# Patient Record
Sex: Female | Born: 1935 | Race: White | Hispanic: No | Marital: Married
Health system: Southern US, Community
[De-identification: ages and names within clinical notes are randomized; demographics above are authoritative.]

---

## 2004-05-20 ENCOUNTER — Other Ambulatory Visit: Payer: Self-pay

## 2007-01-18 ENCOUNTER — Emergency Department: Payer: Self-pay

## 2007-01-18 ENCOUNTER — Other Ambulatory Visit: Payer: Self-pay

## 2007-11-14 ENCOUNTER — Inpatient Hospital Stay: Payer: Self-pay | Admitting: Specialist

## 2007-11-14 ENCOUNTER — Other Ambulatory Visit: Payer: Self-pay

## 2008-01-27 ENCOUNTER — Other Ambulatory Visit: Payer: Self-pay

## 2008-01-27 ENCOUNTER — Emergency Department: Payer: Self-pay | Admitting: Emergency Medicine

## 2008-03-07 ENCOUNTER — Inpatient Hospital Stay: Payer: Self-pay | Admitting: Specialist

## 2008-03-07 ENCOUNTER — Other Ambulatory Visit: Payer: Self-pay

## 2008-07-30 ENCOUNTER — Emergency Department: Payer: Self-pay | Admitting: Internal Medicine

## 2008-12-05 ENCOUNTER — Emergency Department: Payer: Self-pay | Admitting: Emergency Medicine

## 2009-03-07 ENCOUNTER — Emergency Department: Payer: Self-pay | Admitting: Emergency Medicine

## 2009-03-11 ENCOUNTER — Inpatient Hospital Stay: Payer: Self-pay | Admitting: Internal Medicine

## 2009-03-15 ENCOUNTER — Emergency Department: Payer: Self-pay | Admitting: Emergency Medicine

## 2009-03-26 ENCOUNTER — Ambulatory Visit: Payer: Self-pay | Admitting: Neurology

## 2009-06-06 ENCOUNTER — Ambulatory Visit: Payer: Self-pay | Admitting: Neurology

## 2009-12-23 ENCOUNTER — Emergency Department: Payer: Self-pay | Admitting: Emergency Medicine

## 2011-03-04 ENCOUNTER — Ambulatory Visit: Payer: Self-pay | Admitting: Urology

## 2012-09-14 ENCOUNTER — Inpatient Hospital Stay: Payer: Self-pay | Admitting: Internal Medicine

## 2012-09-14 LAB — COMPREHENSIVE METABOLIC PANEL
Alkaline Phosphatase: 112 U/L (ref 50–136)
Anion Gap: 8 (ref 7–16)
BUN: 26 mg/dL — ABNORMAL HIGH (ref 7–18)
Calcium, Total: 9.3 mg/dL (ref 8.5–10.1)
Chloride: 106 mmol/L (ref 98–107)
Co2: 27 mmol/L (ref 21–32)
Creatinine: 1.01 mg/dL (ref 0.60–1.30)
EGFR (African American): 55 — ABNORMAL LOW
EGFR (Non-African Amer.): 48 — ABNORMAL LOW
SGOT(AST): 41 U/L — ABNORMAL HIGH (ref 15–37)
SGPT (ALT): 25 U/L (ref 12–78)
Total Protein: 7 g/dL (ref 6.4–8.2)

## 2012-09-14 LAB — CK TOTAL AND CKMB (NOT AT ARMC)
CK-MB: 3.2 ng/mL (ref 0.5–3.6)
CK-MB: 4.1 ng/mL — ABNORMAL HIGH (ref 0.5–3.6)

## 2012-09-14 LAB — URINALYSIS, COMPLETE
Bacteria: NONE SEEN
Bilirubin,UR: NEGATIVE
Blood: NEGATIVE
Glucose,UR: NEGATIVE mg/dL (ref 0–75)
Hyaline Cast: 1
Ketone: NEGATIVE
Leukocyte Esterase: NEGATIVE
Nitrite: NEGATIVE
Ph: 5 (ref 4.5–8.0)
Specific Gravity: 1.017 (ref 1.003–1.030)
Squamous Epithelial: <1
WBC UR: <1 /HPF (ref 0–5)

## 2012-09-14 LAB — TROPONIN I
Troponin-I: 0.06 ng/mL — ABNORMAL HIGH
Troponin-I: 0.1 ng/mL — ABNORMAL HIGH

## 2012-09-14 LAB — PROTIME-INR
INR: 0.9
Prothrombin Time: 12.7 secs (ref 11.5–14.7)

## 2012-09-14 LAB — CBC
HCT: 47.3 % — ABNORMAL HIGH (ref 35.0–47.0)
HGB: 15.4 g/dL (ref 12.0–16.0)
MCHC: 32.6 g/dL (ref 32.0–36.0)
MCV: 96 fL (ref 80–100)
RDW: 13.6 % (ref 11.5–14.5)
WBC: 22.5 10*3/uL — ABNORMAL HIGH (ref 3.6–11.0)

## 2012-09-14 LAB — TSH: Thyroid Stimulating Horm: 1.44 u[IU]/mL

## 2012-09-15 LAB — CBC WITH DIFFERENTIAL/PLATELET
Basophil #: 0 10*3/uL (ref 0.0–0.1)
Basophil %: 0.3 %
Eosinophil %: 0.5 %
HCT: 33.3 % — ABNORMAL LOW (ref 35.0–47.0)
HGB: 11.1 g/dL — ABNORMAL LOW (ref 12.0–16.0)
Lymphocyte %: 14 %
Monocyte #: 1.1 x10 3/mm — ABNORMAL HIGH (ref 0.2–0.9)
Monocyte %: 8.7 %
Neutrophil #: 9.8 10*3/uL — ABNORMAL HIGH (ref 1.4–6.5)
RBC: 3.48 10*6/uL — ABNORMAL LOW (ref 3.80–5.20)
RDW: 13.5 % (ref 11.5–14.5)
WBC: 12.8 10*3/uL — ABNORMAL HIGH (ref 3.6–11.0)

## 2012-09-15 LAB — TROPONIN I: Troponin-I: 0.13 ng/mL — ABNORMAL HIGH

## 2012-09-15 LAB — BASIC METABOLIC PANEL
Calcium, Total: 8 mg/dL — ABNORMAL LOW (ref 8.5–10.1)
Chloride: 110 mmol/L — ABNORMAL HIGH (ref 98–107)
EGFR (Non-African Amer.): 40 — ABNORMAL LOW
Osmolality: 286 (ref 275–301)
Potassium: 4 mmol/L (ref 3.5–5.1)
Sodium: 142 mmol/L (ref 136–145)

## 2012-09-15 LAB — CK TOTAL AND CKMB (NOT AT ARMC)
CK, Total: 370 U/L — ABNORMAL HIGH (ref 21–215)
CK-MB: 4.6 ng/mL — ABNORMAL HIGH (ref 0.5–3.6)

## 2012-09-20 LAB — CULTURE, BLOOD (SINGLE)

## 2012-12-03 DEATH — deceased

## 2012-12-30 IMAGING — CT CT HEAD WITHOUT CONTRAST
3 of 4 series · 17 of 30 positions shown, 19 images · non-contrast
Comparison: none

REASON FOR EXAM: altered mental status, ground level fall
COMMENTS:   May transport without cardiac monitor

[Series 2: without · axial · non-contrast · 0.42mm/px · z∈[+106,+216]mm · 5 of 34 slices shown]
[im 6/34  brain]
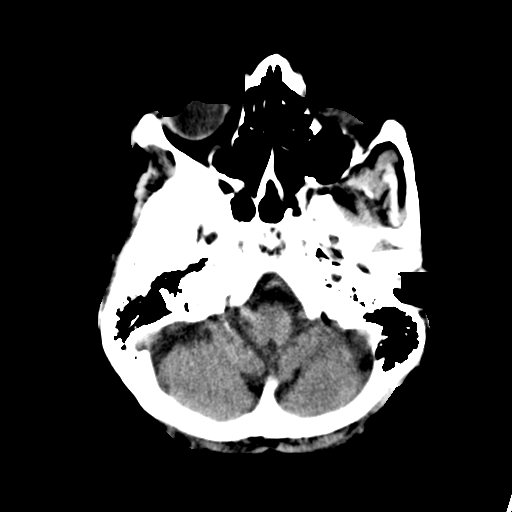
[im 12/34  brain]
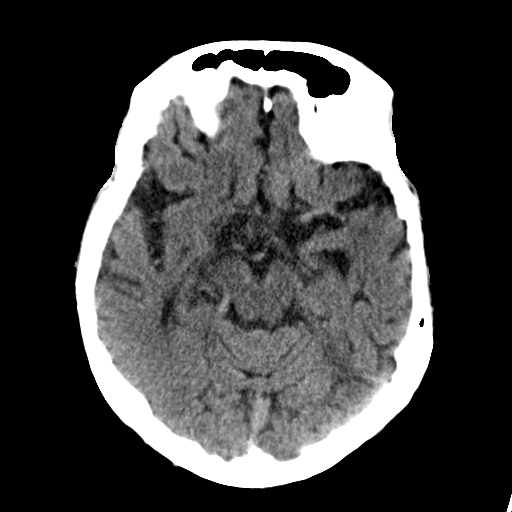
[im 17/34  brain]
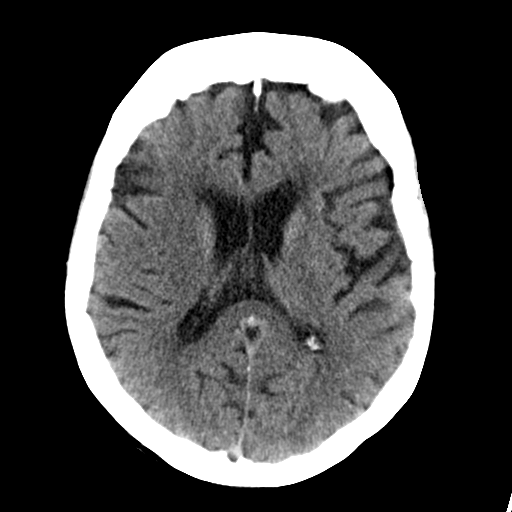
[im 23/34  brain]
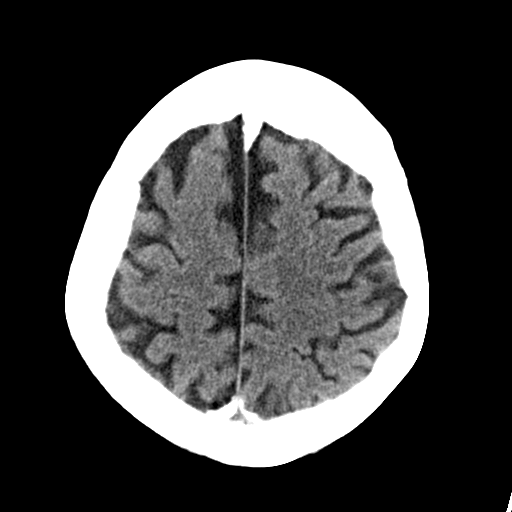
[im 28/34  brain]
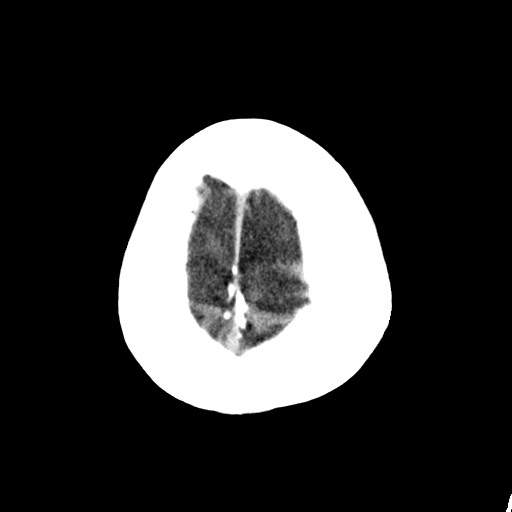

[Series 4: without 2 · axial · non-contrast · 0.42mm/px · z∈[+76,+204]mm · 6 of 38 slices shown, 8 images]
[im 6/38  brain]
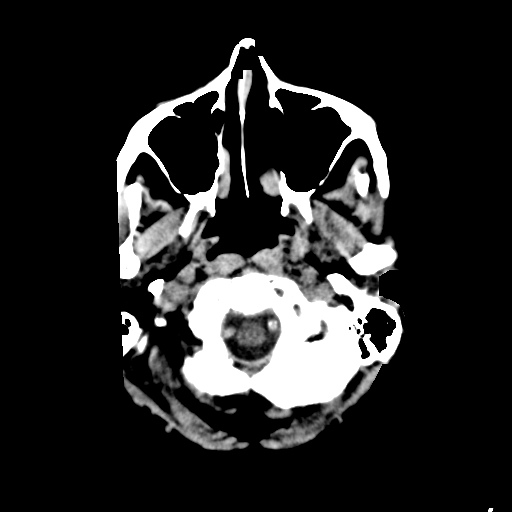
[im 6/38  bone]
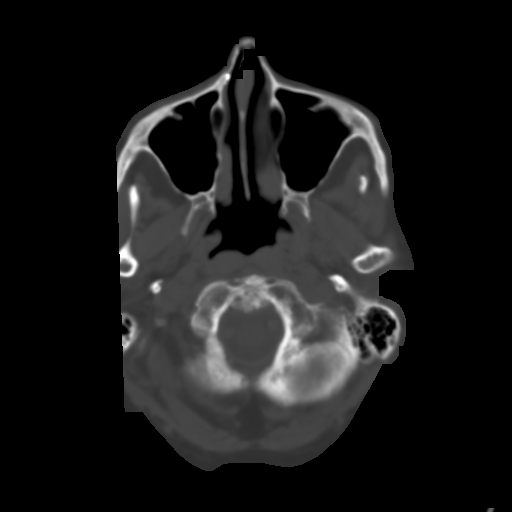
[im 11/38  brain]
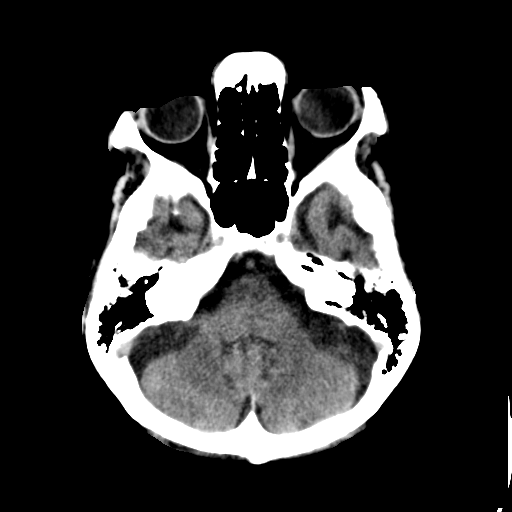
[im 16/38  brain]
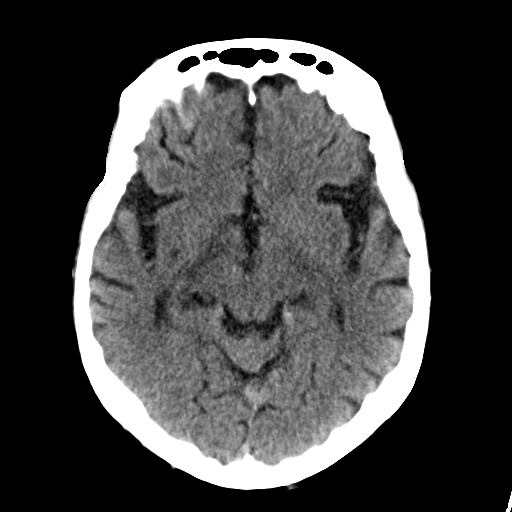
[im 22/38  brain]
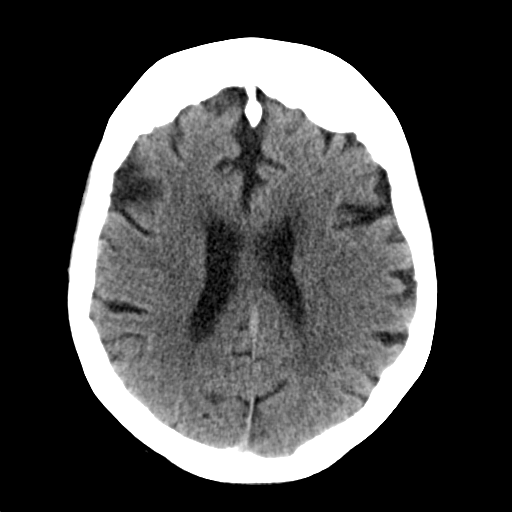
[im 27/38  brain]
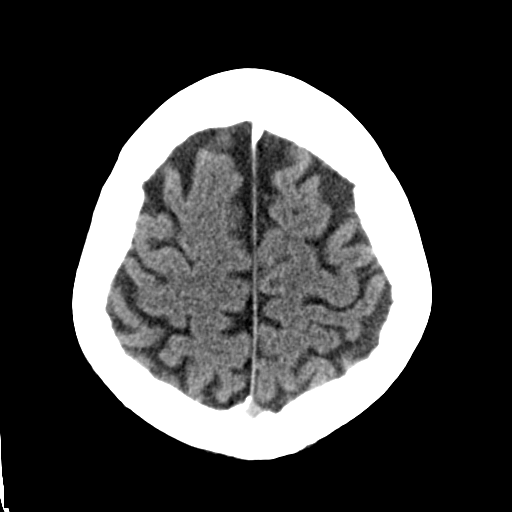
[im 27/38  bone]
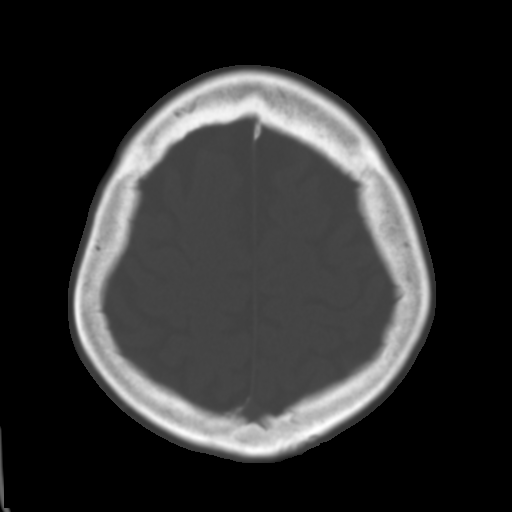
[im 32/38  brain]
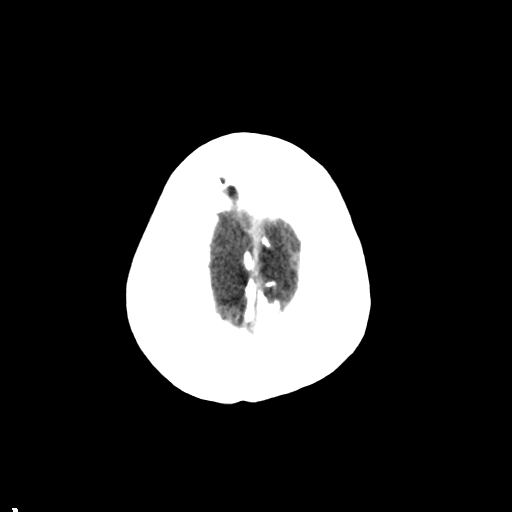

[Series 5: bone 2 · axial · 0.42mm/px · z∈[+88,+212]mm · 6 of 37 slices shown]
[im 6/37  bone]
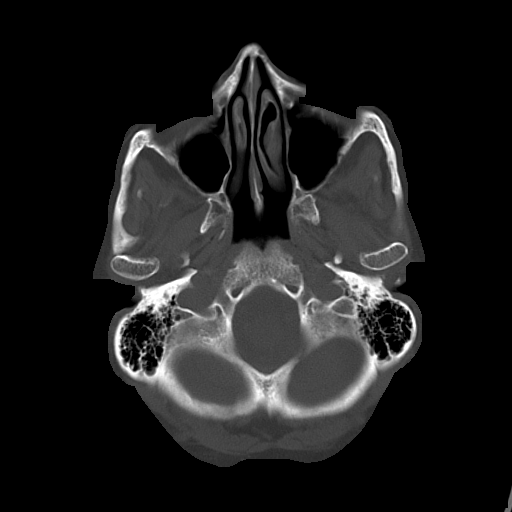
[im 11/37  bone]
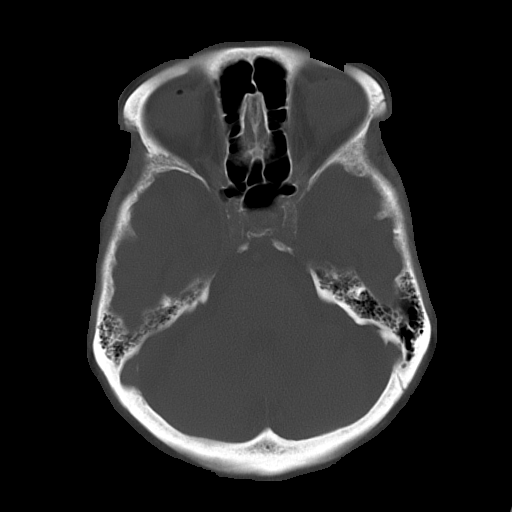
[im 16/37  bone]
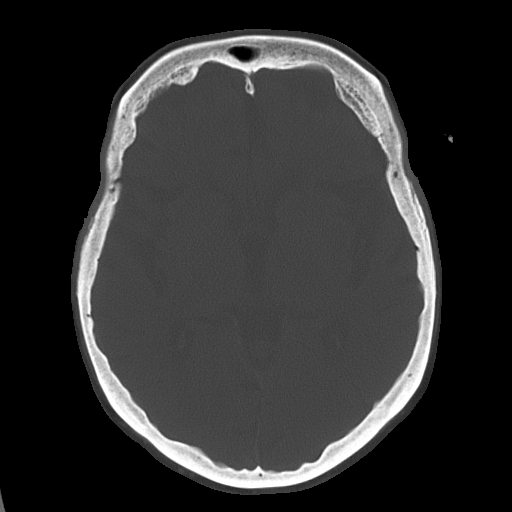
[im 21/37  bone]
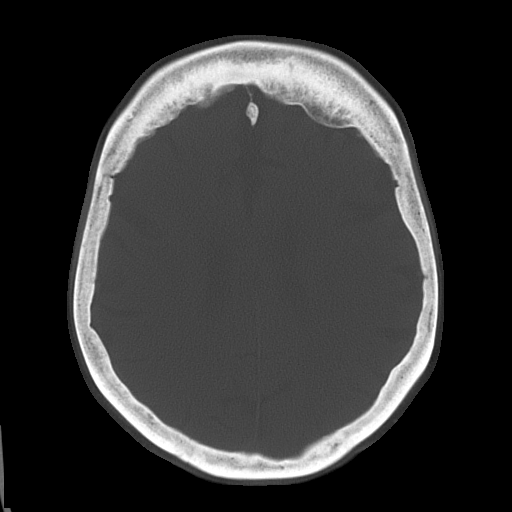
[im 26/37  bone]
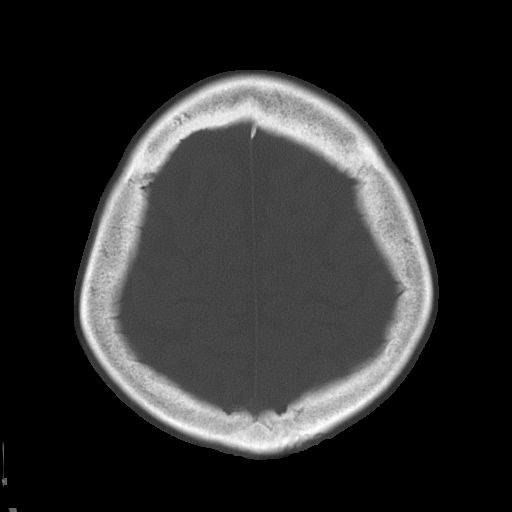
[im 31/37  bone]
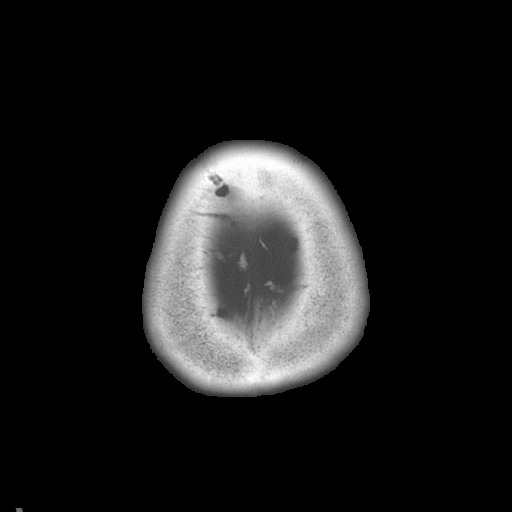

[17 of 30 positions shown; findings below may reference images not displayed]

PROCEDURE:     CT  - CT HEAD WITHOUT CONTRAST  - September 14, 2012  [DATE]

RESULT:     Emergent noncontrast CT of the brain is compared to images dated
11 March, 2009. There is prominence of the ventricles and sulci consistent with
diffuse atrophy. Mild low-attenuation is seen within the periventricular and
subcortical white matter. There is no intracranial hemorrhage, mass or mass
effect evident. No territorial infarct is evident. The sinuses and mastoid
air cells show normal appearing aeration. The orbits appear unremarkable.
The calvarium appears intact.
IMPRESSION: 1. Evidence of atrophy. No acute intracranial abnormality or significant
interval change evident.

[REDACTED]

## 2013-01-03 DEATH — deceased

## 2015-01-22 NOTE — Consult Note (Signed)
PATIENT NAME:  Olivia Johnston, Olivia Johnston MR#:  [id number] DATE OF BIRTH:  08-28-1916  DATE OF CONSULTATION:  09/14/2012  REFERRING PHYSICIAN: Aram Beecham, MD  CONSULTING PHYSICIAN:  Illene Labrador. Angie Fava., MD  CHIEF COMPLAINT: Right knee pain.   HISTORY OF PRESENT ILLNESS: The patient is a 81 year old female resident of [facility] who apparently attempted to get out of bed earlier today and fell. She was found by attendants on the floor and was noted to have significant swelling about the knee and lower right thigh and complained of significant pain with attempts at movement of the knee and lower leg. There was no apparent loss of consciousness, although the patient was noted to be confused. There were no other apparent injuries.   PAST MEDICAL HISTORY:  1. Senile dementia.  2. Hyperlipidemia.  3. Remote history of pulmonary embolism.  4. Anxiety/depression.  5. Atherosclerotic cardiovascular disease. 6. Vitamin B12 deficiency. 7. Hypertension. 8. Orthostatic hypotension. 9. Osteoarthritis. 10. Seizure disorder.  11. Chronic renal failure.   PAST SURGICAL HISTORY:    1. Percutaneous transluminal coronary angioplasty and stent placement in 1995.  2. Hysterectomy.  3. Cataract surgery.   MEDICATIONS AT THE TIME OF ADMISSION:  1. Zocor 20 mg daily.  2. Lexapro 20 mg daily.  3. Keppra 500 mg b.i.d.  4. Neurontin 100 mg b.i.d.  5. Aspirin 81 mg daily.  6. Vasotec 5 mg daily.  7. Vitamin B12 100 mcg IM monthly. 8. Colace 100 mg daily. 9. Celebrex 200 mg daily. 10. Atenolol 12.5 mg daily.   ALLERGIES: No known drug allergies.   SOCIAL HISTORY: The patient lives in the nursing home unit at [facility]. No apparent tobacco or alcohol use. According to reports from the facility, the patient was a nonambulator.   FAMILY HISTORY: Positive for hypertension and diabetes.   REVIEW OF SYSTEMS: I was unable to obtain a complete review of systems due to mental status changes.   PHYSICAL  EXAMINATION:  GENERAL: The patient is an elderly female seen in no acute distress.   HEENT: Atraumatic, normocephalic. Sclerae are clear. Extraocular motions intact. Oropharynx is clear with moist mucosa.   NECK: Supple, nontender, and with good range of motion.   LUNGS: Clear to auscultation bilaterally.   CARDIAC: Regular rate and rhythm with normal S1 and S2. No appreciable gallops or rubs.   ABDOMEN: Soft, nontender, nondistended.   EXTREMITIES: No tenderness to palpation to the upper extremities. No gross irregular alignment or splinting is noted. Examination of the lower extremities demonstrates significant soft tissue swelling about the right lower thigh and knee. Knee effusion is present. There is tenderness to palpation along the distal femur. Attempted range of motion elicited pain by the patient. No gross tenderness to palpation to the lower legs.   NEUROLOGIC: The patient is awake, alert, but disoriented. She has difficulty following simple commands. A significant tremor is noted to both upper extremities. Sensory function is grossly intact. Motor strength is grossly within normal limits and appropriate for age with the exception of the right lower extremity which was not assessed due to the injury.   X-RAYS: AP of the pelvis obtained earlier today was reviewed. Diffuse osteopenia is appreciated. Some degenerative changes were noted to both hips. I do not appreciate definitive evidence of hip fracture to either hip.   I also reviewed multiple views of the right knee and lower femur. There is somewhat impacted distal femur fracture. Again, diffuse osteopenia was appreciated.   IMPRESSION: Right distal  femur fracture.   PLAN: Medical work-up is in place for the mental status changes as well as elevation in cardiac enzymes. Given the patient's fragile medical status and her nonambulatory status prior to this injury, I would recommend simple immobilization with knee immobilizer to  allow for comfort with routine care. Orders were placed for a knee immobilizer as needed as well as for appropriate oral analgesia. I will continue to follow the patient with you.    ____________________________ Illene LabradorJames P. Angie FavaHooten Jr., MD jph:ap D: 09/14/2012 20:51:22 ET T: 09/15/2012 07:48:57 ET JOB#: [id number]  cc: Illene LabradorJames P. Angie FavaHooten Jr., MD, <Dictator> Illene LabradorJAMES P Angie FavaHOOTEN JR MD ELECTRONICALLY SIGNED 09/16/2012 19:47

## 2015-01-22 NOTE — Consult Note (Signed)
Brief Consult Note: Diagnosis: Right distal femur fracture.   Patient was seen by consultant.   Consult note dictated.   Comments: I would recommend immobilization and nonsurgical treatment given the patient's fragile medical staus and nonabulatory status.  Electronic Signatures: Donato HeinzHooten, James P (MD)  (Signed 11-Dec-13 20:52)  Authored: Brief Consult Note   Last Updated: 11-Dec-13 20:52 by Donato HeinzHooten, James P (MD)

## 2015-01-22 NOTE — H&P (Signed)
PATIENT NAME:  Olivia Johnston, Olivia B MR#:  [id number] DATE OF BIRTH:  01-20-1916  DATE OF ADMISSION:  09/14/2012  REFERRING PHYSICIAN: Dr. Margarita GrizzleWoodruff  FAMILY PHYSICIAN: Dr. Burnett ShengHedrick   REASON FOR ADMISSION: Altered mental status with right hip fracture.   HISTORY OF PRESENT ILLNESS: The patient is a 81 year old female followed by Dr[facility] Burn[facility]Hedrick at [facility] with a history of seizures, anxiety/depression, hypertension, as well as senile dementia. Apparently, the patient was trying to get out of bed today when she fell [facility], injuring her right hip. She was brought to the Emergency Room where she was found to be confused with a right hip fracture. She was also noted to have leukocytosis and elevated troponin. She is now admitted for further evaluation.   PAST MEDICAL HISTORY:  1. Chronic renal insufficiency.  2. Seizure disorder.  3. Osteoarthritis.  4. Orthostatic hypotension.  5. Benign hypertension.  6. B12 deficiency.  7. Atherosclerotic cardiovascular disease status post percutaneous transluminal coronary angioplasty with stent placement in 1995.  8. Anxiety/depression.  9. Remote history of pulmonary embolism.  10. Hyperlipidemia.  11. Status post hysterectomy.  12. Status post cataract surgery.  13. Senile dementia.   MEDICATIONS:  1. Zocor 20 mg p.o. daily.  2. Lexapro 20 mg p.o. daily.  3. Keppra 500 mg p.o. b.i.d.  4. Neurontin 100 mg p.o. b.i.d.  5. Aspirin 81 mg p.o. daily.  6. Vasotec 5 mg p.o. daily.  7. B12 1000 mcg IM q. month.  8. Colace 100 mg p.o. daily.  9. Celebrex 200 mg p.o. daily.  10. Atenolol 12.5 mg p.o. daily.   ALLERGIES: No known drug allergies.   SOCIAL HISTORY: The patient lives at the nursing home. There is no apparent history of alcohol or tobacco abuse.   FAMILY HISTORY: Apparently positive for hypertension and diabetes.   REVIEW OF SYSTEMS: Unable to obtain from the patient due to her dementia.   PHYSICAL EXAMINATION:  GENERAL: The  patient is chronically ill appearing, in no acute distress.   VITAL SIGNS: Vital signs are currently remarkable for a blood pressure of 80/50, with a heart rate of 72 and a respiratory rate of 18. She is afebrile.   HEENT: Normocephalic, atraumatic. Pupils equally round and reactive to light and accommodation. Extraocular movements are intact. Sclerae are anicteric. Conjunctivae are clear.  Oropharynx is clear.   NECK: Supple without jugular venous distention. No adenopathy or thyromegaly is noted.   LUNGS: Clear to auscultation and percussion. No wheezes or rhonchi. No rales.   CARDIAC: Regular rate and rhythm. Normal S1 and S2. No significant rubs or gallops noted. Chest wall is nontender.   ABDOMEN: Soft, nontender, with normoactive bowel sounds. No organomegaly or masses were appreciated. No hernias or bruits were noted.   EXTREMITIES: Without clubbing, cyanosis, or edema. Pulses were 2+ bilaterally.   SKIN: Warm and dry without rash or lesions.   NEUROLOGIC: Cranial nerves II through XII grossly intact. Deep tendon reflexes were symmetric. Motor and sensory exams nonfocal.   PSYCH: The patient was alert but disoriented to place and time. She was oriented to person. She could not answer questions consistently.   LABORATORY DATA: White count was 22.5 with a hemoglobin of 15.4. Her glucose was 120 with a BUN of 26, creatinine 1.01 with a GFR of 48. Potassium was 5.2 with a chloride of 106. Troponin was 0.06. TSH was 1.44. Urinalysis was unremarkable but was sent after antibiotics were given. Head CT showed  no acute abnormality. Chest x-ray was unremarkable. Hip films revealed a right hip fracture. EKG revealed sinus rhythm with no acute ischemic changes.   ASSESSMENT:  1. Right hip fracture.  2. Altered mental status.  3. Elevated troponin.  4. Leukocytosis.  5. Hypotension.  6. Senile dementia.  7. Seizure disorder.  8. Chronic renal insufficiency.  9. History of orthostatic  hypotension.  10. B12 deficiency.  11. Atherosclerotic cardiovascular disease status post percutaneous transluminal coronary angioplasty in 1995.   PLAN: The patient will be admitted to the floor with telemetry as a NO CODE BLUE, DO NOT RESUSCITATE according to her status at [facility]. She will be placed on IV fluids with empiric IV antibiotics. Blood and urine cultures have been sent off. We will follow serial cardiac enzymes and obtain an echocardiogram. We will consult orthopedics in regards to her hip fracture. We will also consult cardiology in regards to her elevated troponin. We will begin IV fluids for volume resuscitation in hopes of improving her hypotension. We will hold her Vasotec at this time. Neuro checks every four hours. Bed rest for now with Foley catheter.  Further treatment and evaluation will depend upon the patient's progress.      TOTAL TIME SPENT ON THIS PATIENT: 50 minutes.     ____________________________ Duane Lope Judithann Sheen, MD jds:bjt D: 09/14/2012 16:40:33 ET T: 09/14/2012 17:22:52 ET JOB#: [id number]  cc: Duane Lope. Judithann Sheen, MD, <Dictator> Rhona Leavens. Burnett Sheng, MD JEFFREY Rodena Medin MD ELECTRONICALLY SIGNED 09/14/2012 21:20

## 2015-01-25 NOTE — Discharge Summary (Signed)
PATIENT NAME:  Olivia Johnston, Olivia Johnston MR#:  [id number] DATE OF BIRTH:  03-Nov-1915  DATE OF ADMISSION:  09/14/2012 DATE OF DISCHARGE:  09/16/2012  DIS[facility]ITION: [facility].   CODE STATUS: NO CODE, DO NOT RESUSCITATE.    PRESENTING COMPLAINT: Altered mental status with right hip fracture.   DISCHARGE DIAGNOSES:  1. Right hip fracture. The patient is bedbound at baseline, conservative management per Dr. Ernest PineHooten. Per Dr. Ernest PineHooten, the family is in agreement.  2. Altered mental status, confusion, likely related to dementia. The patient appears at baseline.  3. Elevated troponin likely in the setting of stress and acute renal failure, appears demand ischemia.  4. Leukocytosis, reactive.  5. History of seizures.  6. Hypertension.  7. Gastroesophageal reflux disease.  8. Depression.   DISCHARGE MEDICATIONS:  1. Tylenol 650 mg p.o. every 4 hours p.r.n.  2. Aspirin 81 mg daily.  3. Atenolol 12.5 mg p.o. daily.  4. Docusate 100 mg Johnston.i.d.  5. Lexapro 20 mg daily.  6. Neurontin 100 mg Johnston.i.d.  7. Keppra 500 mg Johnston.i.d.  8. Tramadol 50 mg q. 6 p.r.n. for pain.  9. Artificial Tears 1 drop both eyes t.i.d.  10. Celebrex 200 mg daily.  11. Cyanocobalamin injection 1000 mcg intramuscular monthly.  12. Polyethylene glycol 17 grams p.o. daily p.r.n.  13. Simvastatin 20 mg at bedtime.  DISCHARGE LABORATORY DATA: Hemoglobin and hematocrit is 11.1 and 33.3, white count is 12.8. Glucose is 99, BUN 20, creatinine 1.16, sodium 142, potassium 4, chloride is 110, bicarbonate is 26, calcium 8.0.  completed d/c summary  ____________________________ Jearl KlinefelterSona A. Allena KatzPatel, MD sap:cbb D: 09/16/2012 12:27:37 ET T: 09/16/2012 12:43:44 ET JOB#: [id number]  cc: Canuto Kingston A. Allena KatzPatel, MD, <Dictator> Willow OraSONA A Addis Tuohy MD ELECTRONICALLY SIGNED 10/14/2012 12:34

## 2015-01-25 NOTE — Discharge Summary (Signed)
PATIENT NAME:  Olivia Johnston, Olivia Johnston MR#:  [id number] DATE OF BIRTH:  May 22, 1916  DATE OF ADMISSION:  09/14/2012 DATE OF DISCHARGE:  09/16/2012  CONTINUATION FOR DISCHARGE SUMMARY.  LABORATORY, DIAGNOSTIC AND RADIOLOGICAL DATA: Calcium 8.0. Troponin 0.13, 0.10. Sats 91% on room air. Blood cultures negative in 36 hours. Urinalysis negative for urinary tract infection. EKG normal sinus rhythm. PT-INR 12.7 and 0.9. TSH 1.44.   CONSULTATION: Orthopedic consultation with Dr. Ernest PineHooten.   BRIEF SUMMARY OF HOSPITAL COURSE: Olivia Johnston is a pleasant 81 year old Caucasian female with past medical history of seizure disorder, hypertension, depression, hyperlipidemia, osteoarthritis, chronic kidney disease came to the hospital with fall and noted to have right hip fracture. She was admitted with:  1. Altered mental status/confusion which was suspected from her dementia. No evidence of acute metabolic or neurologic abnormality. CT head was negative. Urinalysis negative. Chest x-ray negative for infection. Patient currently appears pleasant and at baseline.  2. Right hip fracture. Patient is normally bedbound at baseline. Conservative management has been decided after Dr. Ernest PineHooten discussed with son, [name]. They are in agreement given patient being bedbound and overall other comorbidities puts her at a high risk for surgery.  3. Elevated troponin likely in the setting of stress and acute renal failure. No evidence of chest pain. No symptoms or clinical findings suggestive of ACS. Echo showed normal LV function.  4. Leukocytosis, again appears reactive. No source of infection noted.  5. History of seizure disorder. Continued Keppra.   6. Hypertension, on atenolol. Patient's blood pressure was a little on the lower side. Enalapril has been discontinued.   7. Depression. Will continue patient on Lexapro.    DIET: 2 grams sodium diet.   CODE STATUS: NO CODE, DO NOT RESUSCITATE. Above was discussed with patient's  son, [name].   TIME SPENT: 40 minutes.   ____________________________ Wylie HailSona A. Allena KatzPatel, MD sap:cms D: 09/16/2012 12:32:17 ET T: 09/16/2012 12:39:52 ET JOB#: [id number]  cc: Newton Frutiger A. Allena KatzPatel, MD, <Dictator> Illene LabradorJames P. Angie FavaHooten Jr., MD Willow OraSONA A Sherrell Weir MD ELECTRONICALLY SIGNED 10/05/2012 10:33
